# Patient Record
Sex: Male | Born: 1985 | Race: Black or African American | Hispanic: No | Marital: Single | Smoking: Current every day smoker
Health system: Southern US, Community
[De-identification: ages and names within clinical notes are randomized; demographics above are authoritative.]

---
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
## 2008-08-25 ENCOUNTER — Emergency Department (HOSPITAL_COMMUNITY): Admission: EM | Admit: 2008-08-25 | Discharge: 2008-08-25 | Payer: Self-pay | Admitting: Emergency Medicine

## 2008-10-07 ENCOUNTER — Emergency Department (HOSPITAL_COMMUNITY): Admission: EM | Admit: 2008-10-07 | Discharge: 2008-10-07 | Payer: Self-pay | Admitting: Emergency Medicine

## 2008-10-15 ENCOUNTER — Emergency Department (HOSPITAL_COMMUNITY): Admission: EM | Admit: 2008-10-15 | Discharge: 2008-10-15 | Payer: Self-pay | Admitting: Emergency Medicine

## 2008-12-28 ENCOUNTER — Emergency Department (HOSPITAL_COMMUNITY): Admission: EM | Admit: 2008-12-28 | Discharge: 2008-12-28 | Payer: Self-pay | Admitting: Emergency Medicine

## 2009-11-12 ENCOUNTER — Emergency Department (HOSPITAL_COMMUNITY): Admission: EM | Admit: 2009-11-12 | Discharge: 2009-11-12 | Payer: Self-pay | Admitting: Emergency Medicine

## 2011-02-04 NOTE — Op Note (Signed)
[place]. [facility]  Patient:    Martin Conrad, Martin Conrad                        MRN: [id number] Adm. Date:  [id number] Attending:  Leonia Corona                           Operative Report  PREOPERATIVE DIAGNOSES:  Foreign body, left foot.  POSTOPERATIVE DIAGNOSES:  Foreign body, left foot.  PROCEDURE:  Extraction of foreign body from left sole of the foot.  ANESTHESIA:  Local.  PROCEDURE IN DETAIL:  The patient is brought into operating room room, placed supine on operating room table.  The left foot is cleaned, prepped, and draped.  About 5 cc of 1% lidocaine is infiltrated in the sole of the left foot around the location of the foreign body.  After checking the effectiveness of the local anesthesia, a transverse about 0.5 cm is made right above the maximal elevation and probable side of foreign body.  A fine tip hemostat is used to explore this incision.  Blackish looking tip of what was noticed and was pulled out without any difficulty.  The wound was irrigated with hydrogen peroxide and left opened for healing.  Neosporin was applied and a Band-aid was placed.  No oozing or bleeding was noted.  Patient tolerated the procedure very well which was smooth and uneventful.  Patient was allowed to go home with instructions to follow-up. DD:  10/18/00 TD:  10/18/00 Job: [id number]

## 2012-07-01 ENCOUNTER — Emergency Department (HOSPITAL_COMMUNITY): Payer: No Typology Code available for payment source

## 2012-07-01 ENCOUNTER — Emergency Department (HOSPITAL_COMMUNITY)
Admission: EM | Admit: 2012-07-01 | Discharge: 2012-07-01 | Disposition: A | Discharge status: Home / Self Care | Payer: No Typology Code available for payment source | Attending: Emergency Medicine | Admitting: Emergency Medicine

## 2012-07-01 ENCOUNTER — Encounter (HOSPITAL_COMMUNITY): Payer: Self-pay | Admitting: *Deleted

## 2012-07-01 DIAGNOSIS — M549 Dorsalgia, unspecified: Secondary | ICD-10-CM

## 2012-07-01 DIAGNOSIS — M546 Pain in thoracic spine: Secondary | ICD-10-CM | POA: Insufficient documentation

## 2012-07-01 DIAGNOSIS — M542 Cervicalgia: Secondary | ICD-10-CM | POA: Insufficient documentation

## 2012-07-01 MED ORDER — ONDANSETRON HCL 4 MG/2ML IJ SOLN
4.0000 mg | Freq: Once | INTRAMUSCULAR | Status: AC
  Administered 2012-07-01: 4 mg via INTRAVENOUS
  Filled 2012-07-01: qty 2

## 2012-07-01 MED ORDER — HYDROCODONE-ACETAMINOPHEN 5-500 MG PO TABS: 1.0000 | ORAL_TABLET | Freq: Three times a day (TID) | ORAL | Status: DC | PRN

## 2012-07-01 MED ORDER — CYCLOBENZAPRINE HCL 10 MG PO TABS: 10.0000 mg | ORAL_TABLET | Freq: Two times a day (BID) | ORAL | Status: DC | PRN

## 2012-07-01 MED ORDER — IBUPROFEN 800 MG PO TABS: 800.0000 mg | ORAL_TABLET | Freq: Three times a day (TID) | ORAL | Status: DC

## 2012-07-01 MED ORDER — HYDROMORPHONE HCL PF 1 MG/ML IJ SOLN
1.0000 mg | Freq: Once | INTRAMUSCULAR | Status: AC
  Administered 2012-07-01: 1 mg via INTRAVENOUS
  Filled 2012-07-01: qty 1
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
## 2012-07-01 NOTE — ED Notes (Signed)
Patient transported to CT 

## 2012-07-02 ENCOUNTER — Telehealth (HOSPITAL_COMMUNITY): Payer: Self-pay | Admitting: *Deleted

## 2012-07-02 NOTE — ED Notes (Signed)
Pharmacy called wanting permission to fill RX for Vicodin only. Ok per [facility].

## 2013-10-26 ENCOUNTER — Encounter (HOSPITAL_COMMUNITY): Payer: Self-pay | Admitting: Emergency Medicine

## 2013-10-26 ENCOUNTER — Emergency Department (HOSPITAL_COMMUNITY)
Admission: EM | Admit: 2013-10-26 | Discharge: 2013-10-26 | Disposition: A | Discharge status: Home / Self Care | Payer: No Typology Code available for payment source | Attending: Emergency Medicine | Admitting: Emergency Medicine

## 2013-10-26 DIAGNOSIS — K0889 Other specified disorders of teeth and supporting structures: Secondary | ICD-10-CM

## 2013-10-26 DIAGNOSIS — R51 Headache: Secondary | ICD-10-CM | POA: Insufficient documentation

## 2013-10-26 DIAGNOSIS — K089 Disorder of teeth and supporting structures, unspecified: Secondary | ICD-10-CM | POA: Insufficient documentation

## 2013-10-26 DIAGNOSIS — F17200 Nicotine dependence, unspecified, uncomplicated: Secondary | ICD-10-CM | POA: Insufficient documentation

## 2013-10-26 MED ORDER — IBUPROFEN 800 MG PO TABS: 800.0000 mg | ORAL_TABLET | Freq: Three times a day (TID) | ORAL | Status: DC

## 2013-10-26 MED ORDER — AMOXICILLIN 500 MG PO CAPS: 500.0000 mg | ORAL_CAPSULE | Freq: Three times a day (TID) | ORAL | Status: DC

## 2013-10-26 MED ORDER — HYDROCODONE-ACETAMINOPHEN 5-325 MG PO TABS: 1.0000 | ORAL_TABLET | Freq: Four times a day (QID) | ORAL | Status: DC | PRN

## 2013-10-26 NOTE — ED Notes (Signed)
Pt reports left lower side dental pain x 6 months, no relief with oragel now and also having headaches.

## 2013-10-26 NOTE — ED Provider Notes (Signed)
CSN: [id number]     Arrival date & time 10/26/13  0920 History  This chart was scribed for Jaynie Crumble, PA working with Flint Melter, MD by Quintella Reichert, ED Scribe. This patient was seen in room TR06C/TR06C and the patient's care was started at 9:57 AM.   Chief Complaint  Patient presents with  . Dental Pain    The history is provided by the patient. No language interpreter was used.    HPI Comments: Martin Conrad is a 28 y.o. male who presents to the Emergency Department complaining of lower left-sided dental pain that has been intermittent for the past 6 months and worsening over the past 3 days.  Pt states he has a broken tooth in that area.  He describes pain as severe.  He has attempted to treat pain with Nyquil and Tylenol without relief.  He has not used ibuprofen.  Pt also complains of intermittent associated headaches worsened by coughing.  Pt does not have a dentist or dental insurance.    History reviewed. No pertinent past medical history.  History reviewed. No pertinent past surgical history.  History reviewed. No pertinent family history.   History  Substance Use Topics  . Smoking status: Current Every Day Smoker  . Smokeless tobacco: Not on file  . Alcohol Use: Yes     Review of Systems  HENT: Positive for dental problem.   Neurological: Positive for headaches.     Allergies  Ceclor  Home Medications  No current outpatient prescriptions on file.  BP 121/70  Pulse 80  Temp(Src) 97.9 F (36.6 C) (Oral)  Resp 16  Ht 6\' 1"  (1.854 m)  Wt 190 lb (86.183 kg)  BMI 25.07 kg/m2  SpO2 100%  Physical Exam  Nursing note and vitals reviewed. Constitutional: He is oriented to person, place, and time. He appears well-developed and well-nourished. No distress.  HENT:  Head: Normocephalic and atraumatic.  Right Ear: Tympanic membrane and ear canal normal.  Left Ear: Tympanic membrane and ear canal normal.  Mouth/Throat: Oropharynx is clear and  moist. No trismus in the jaw. No oropharyngeal exudate, posterior oropharyngeal edema or posterior oropharyngeal erythema.  Broken off left lower second molar.  Gum appears normal. No obvious abscess.  Tooth and gum tender to palpation. No trismus or swelling under the tongue.  Eyes: EOM are normal.  Neck: Neck supple. No tracheal deviation present.  Cardiovascular: Normal rate.   Pulmonary/Chest: Effort normal. No respiratory distress.  Musculoskeletal: Normal range of motion.  Neurological: He is alert and oriented to person, place, and time.  Skin: Skin is warm and dry.  Psychiatric: He has a normal mood and affect. His behavior is normal.    ED Course  Procedures (including critical care time)  DIAGNOSTIC STUDIES: Oxygen Saturation is 100% on room air, normal by my interpretation.    COORDINATION OF CARE: 10:00 AM-Discussed treatment plan which includes pain medication, antibiotics, and dental referral with pt at bedside and pt agreed to plan.     Labs Review Labs Reviewed - No data to display  Imaging Review No results found.  EKG Interpretation   None       MDM   1. Pain, dental      Patient dental pain and a headache. No signs of obvious infection at this time. No trismus the swelling of the tongue. Patient is able to open and close her mouth without difficulties. There is no facial swelling. Suspect dental pain to 2 cavity versus possible  early dental abscess. Will start on amoxicillin, ibuprofen Norco for pain, follow up with a dentist.  Filed Vitals:   10/26/13 0926  BP: 121/70  Pulse: 80  Temp: 97.9 F (36.6 C)  TempSrc: Oral  Resp: 16  Height: 6\' 1"  (1.854 m)  Weight: 190 lb (86.183 kg)  SpO2: 100%    I personally performed the services described in this documentation, which was scribed in my presence. The recorded information has been reviewed and is accurate.    Lottie Musselatyana A Adem Costlow, PA-C 10/26/13 1513

## 2013-10-26 NOTE — Discharge Instructions (Signed)
Ibuprofen for pain. Norco for severe pain. Amoxicillin as prescribed until all gone. Follow up with a dentist.    Dental Pain Toothache is pain in or around a tooth. It may get worse with chewing or with cold or heat.  HOME CARE  Your dentist may use a numbing medicine during treatment. If so, you may need to avoid eating until the medicine wears off. Ask your dentist about this.  Only take medicine as told by your dentist or doctor.  Avoid chewing food near the painful tooth until after all treatment is done. Ask your dentist about this. GET HELP RIGHT AWAY IF:   The problem gets worse or new problems appear.  You have a fever.  There is redness and puffiness (swelling) of the face, jaw, or neck.  You cannot open your mouth.  There is pain in the jaw.  There is very bad pain that is not helped by medicine. MAKE SURE YOU:   Understand these instructions.  Will watch your condition.  Will get help right away if you are not doing well or get worse. Document Released: 02/22/2008 Document Revised: 11/28/2011 Document Reviewed: 02/22/2008 [facility] Patient Information 2014 [place], [place].   Emergency Department Resource Guide 1) Find a Doctor and Pay Out of Pocket Although you won't have to find out who is covered by your insurance plan, it is a good idea to ask around and get recommendations. You will then need to call the office and see if the doctor you have chosen will accept you as a new patient and what types of options they offer for patients who are self-pay. Some doctors offer discounts or will set up payment plans for their patients who do not have insurance, but you will need to ask so you aren't surprised when you get to your appointment.  2) Contact Your Local Health Department Not all health departments have doctors that can see patients for sick visits, but many do, so it is worth a call to see if yours does. If you don't know where your local health department is,  you can check in your phone book. The CDC also has a tool to help you locate your state's health department, and many state websites also have listings of all of their local health departments.  3) Find a Walk-in Clinic If your illness is not likely to be very severe or complicated, you may want to try a walk in clinic. These are popping up all over the country in pharmacies, drugstores, and shopping centers. They're usually staffed by nurse practitioners or physician assistants that have been trained to treat common illnesses and complaints. They're usually fairly quick and inexpensive. However, if you have serious medical issues or chronic medical problems, these are probably not your best option.  No Primary Care Doctor: - Call Health Connect at  [phone] - they can help you locate a primary care doctor that  accepts your insurance, provides certain services, etc. - Physician Referral Service- [phone]  Chronic Pain Problems: Organization         Address  Phone   Notes  [facility]  [phone] Patients need to be referred by their primary care doctor.   Medication Assistance: Organization         Address  Phone   Notes  [facility] Medication [facility] [address] [place] [place], [place] [address] [phone] --Must be a resident of [facility] -- Must have NO insurance coverage  whatsoever (no Medicaid/ Medicare, etc.) -- The pt. MUST have a primary care doctor that directs their care regularly and follows them in the community   MedAssist  [phone]   [employer]  [phone]    Agencies that provide inexpensive medical care: Organization         Address  Phone   Notes  [facility]  [phone]   [facility]    [phone]   [facility] [address] [place], [place] [id number] [phone]   [facility] [address], [place] [phone])  [phone]   Planned Parenthood    [phone]   [facility]    [phone]) [phone]   [facility]  [address], [place] Phone:  [phone], Fax:  [phone] Hours of Operation:  9 am - 6 pm, M-F.  Also accepts Medicaid/Medicare and self-pay.  [facility]  [address], [place] Phone: [phone], Fax: [phone]. Hours of Operation:  8:30 am - 5:30 pm, M-F.  Also accepts Medicaid and self-pay.  [facility] [place] [address], [place] Phone: [phone]) [phone]   [facility] [address], [place], [place] [phone], Ext. [phone] Mondays & Thursdays: 7-9 AM.  First 15 patients are seen on a first come, first serve basis.    Medicaid-accepting [facility] Providers:  Organization         Address  Phone   Notes  [facility] [address], [place] [phone] Also accepts self-pay patients.  [facility] [address], [place]  [phone]   [facility] [address], [place] [phone]   [facility] [address] [place] [address], [place] [phone]) [phone]   Renaye RakersVeita Bland [address], [place]   [phone] Only accepts [place] Access [place] patients after they have their name applied to their card.   Self-Pay (no insurance) in [facility]:  Organization         Address  Phone   Notes  Sickle Cell Patients, [facility] [address], [place] [phone]   [facility] [address] [place], [place] [phone]) [phone]   [facility] [place]  [address], [place] [phone]   [facility]/Dr. Osei-Bonsu  [address] [place] [address], [place] or [phone], [place] [phone] Phone number for both [place] and [place] locations  is the same.  [facility] [address], [place] [phone]) [phone]   [facility] [place] [address] [place] [address], [place] or [address] [phone]   [facility] [address], [place] [phone]) [phone], phone; [phone], fax Sees patients 1st and 3rd Saturday of every month.  Must not qualify for public or private insurance (i.e. Medicaid, Medicare, [place] Health Choice, Veterans' Benefits)  Household income should be no more than 200% of the poverty level The clinic cannot treat you if you are pregnant or think you are pregnant  Sexually transmitted diseases are not treated at the clinic.    Dental Care: Organization         Address  Phone  Notes  [facility] Department of  Public Health [facility] [address] [place], [place] [phone] Accepts children up to age 32 who are enrolled in [place] or [place] Health Choice; pregnant women with a Medicaid card; and children who have applied for Medicaid or [place] Health Choice, but were declined, whose parents can pay a reduced fee at time of service.  [facility] Department of [facility]  [address], [place] [phone] Accepts children up to age 62 who are enrolled in [place] or [place] Health Choice; pregnant women with a Medicaid card; and children who have applied for Medicaid or [place] Health Choice, but were declined, whose parents can pay a reduced fee at time of service.  [facility]  [address] [place], [place] [phone] Patients are seen by appointment only. Walk-ins are not accepted. [facility] will see patients 81 years of age and older. Monday - Tuesday (8am-5pm) Most Wednesdays (8:30-5pm) $30 per visit, cash only  [facility] Adult Dental Access PROGRAM  [address], [facility] [phone] Patients are seen by appointment only. Walk-ins are not accepted. [facility] will see  patients 56 years of age and older. One Wednesday Evening (Monthly: Volunteer Based).  $30 per visit, cash only  [employer]  [phone] for adults; Children under age 73, call Graduate Pediatric Dentistry at [phone]. Children aged 56-14, please call [phone] to request a pediatric application.  Dental services are provided in all areas of dental care including fillings, crowns and bridges, complete and partial dentures, implants, gum treatment, root canals, and extractions. Preventive care is also provided. Treatment is provided to both adults and children. Patients are selected via a lottery and there is often a waiting list.   [facility] [address], [place]  [phone] [email or web address]   [facility] [address] [place], [place] [phone], Ext. [phone] Second and Fourth Thursday of each month, opens at 6:30 AM; Clinic ends at 9 AM.  Patients are seen on a first-come first-served basis, and a limited number are seen during each clinic.   [facility]  [address] Ether Griffins [place], [place] [phone]   Eligibility Requirements You must have lived in [place], [place], or [place] counties for at least the last three months.   You cannot be eligible for state or federal sponsored [place], including [employer], [place], or [employer].   You generally cannot be eligible for healthcare insurance through your employer.    How to apply: Eligibility screenings are held every Tuesday and Wednesday afternoon from 1:00 pm until 4:00 pm. You do not need an appointment for the interview!  [facility] [address], [place], [place] [phone]   [facility] Health Department  [phone]   [facility] Health Department  [phone]   [facility] Health Department  [phone]    Behavioral Health Resources in the Community: Intensive Outpatient  Programs Organization         Address  Phone  Notes  [facility] [address], [place], [place] [phone]   [facility] Outpatient [address], [place], [place] [phone]   [facility] [address], [place], [place]  [phone]   [facility] [address],  [place], [place] [phone] or [phone]   Substance Abuse Resources Organization         Address  Phone  Notes  Alcohol and Drug Services  [place]  [phone]   [facility] [place]  [phone]   Chinita Pester  [phone]   Residential & Outpatient Substance Abuse Program  [phone]   Psychological Services Organization         Address  Phone  Notes  [facility] [place]  [place]  [phone]   [place] [address], [place] or [phone]    Mobile Crisis Teams Organization         Address  Phone  Notes  [facility], Mobile Crisis Care Unit  [phone]   [facility]  [address] [place], [place]   [place] [address] [place] [phone]    Self-Help/Support Groups Organization         Address  Phone             Notes  [place] of [place] - variety of support groups  [place] Call for more information  Narcotics Anonymous (NA), [facility]aring [facility] [address], [employer] [place]  2 meetings at this location   Special educational needs teacher         Address  Phone  Notes  ASAP Residential Treatment Rafael Gonzalez,    [place]  [phone]   [facility]  [address] [place] [address], [place], [place]   [place], [place] [phone] Admissions: 8am-3pm M-F  Incentives Substance [place] [address],    [place], [place]  [phone]   [facility] [address] [place], [place], [place]   [facility] [address]  [place], [place]   Insight Programs - Intensive Outpatient [address], [place], [place]   [facility] ([place].,  [place], [place] [phone] or [phone]   [facility] (RTS) [address] [place], [place] Accepts Medicaid  [facility] [place] [address]  [place] [phone] Substance Abuse/Addiction Treatment   [facility] Organization         Address  Phone  Notes  [facility]  [phone]   Domenic Schwab, PhD [address] Arlis [place], [place]   [phone] or [phone]   [place] Bernie [place], [place] [phone]   [facility] [address], [place], [place] [phone] Insurance/Medicaid/sponsorship through [facility] and Families [address]                                    [place], [place] [phone] [place] [address], [place] [phone]    Dr. Adele Schilder  [phone]   [facility] Dept. 1) [address] [place] 2) [place] 3)  [place] [address], [place] [phone]  [phone]   [place] [phone] or [phone] (After Hours)

## 2013-10-26 NOTE — ED Provider Notes (Signed)
Medical screening examination/treatment/procedure(s) were performed by non-physician practitioner and as supervising physician I was immediately available for consultation/collaboration.  Flint MelterElliott L Annina Piotrowski, MD 10/26/13 [phone]

## 2015-08-07 ENCOUNTER — Emergency Department (HOSPITAL_COMMUNITY): Payer: Self-pay

## 2015-08-07 ENCOUNTER — Emergency Department (HOSPITAL_COMMUNITY)
Admission: EM | Admit: 2015-08-07 | Discharge: 2015-08-07 | Disposition: A | Discharge status: Home / Self Care | Payer: Self-pay | Attending: Emergency Medicine | Admitting: Emergency Medicine

## 2015-08-07 ENCOUNTER — Encounter (HOSPITAL_COMMUNITY): Payer: Self-pay | Admitting: *Deleted

## 2015-08-07 DIAGNOSIS — F17210 Nicotine dependence, cigarettes, uncomplicated: Secondary | ICD-10-CM | POA: Insufficient documentation

## 2015-08-07 DIAGNOSIS — R079 Chest pain, unspecified: Secondary | ICD-10-CM | POA: Insufficient documentation

## 2015-08-07 DIAGNOSIS — H938X2 Other specified disorders of left ear: Secondary | ICD-10-CM | POA: Insufficient documentation

## 2015-08-07 DIAGNOSIS — Z791 Long term (current) use of non-steroidal anti-inflammatories (NSAID): Secondary | ICD-10-CM | POA: Insufficient documentation

## 2015-08-07 DIAGNOSIS — J069 Acute upper respiratory infection, unspecified: Secondary | ICD-10-CM | POA: Insufficient documentation

## 2015-08-07 DIAGNOSIS — Z792 Long term (current) use of antibiotics: Secondary | ICD-10-CM | POA: Insufficient documentation

## 2015-08-07 NOTE — ED Notes (Signed)
Pt c/o chest pain and cough since last Wed.  States "cold like symptoms" prior to pain.  States his girlfriend was just dx with an URI and it freaked him out, so he's here.

## 2015-08-07 NOTE — ED Notes (Signed)
Pt here for c/o anterior chest pain x 1 week. States he 'had a cold and cough' recently. Pt is a smoker.

## 2015-08-07 NOTE — Discharge Instructions (Signed)
Upper Respiratory Infection, Adult °Most upper respiratory infections (URIs) are a viral infection of the air passages leading to the lungs. A URI affects the nose, throat, and upper air passages. The most common type of URI is nasopharyngitis and is typically referred to as "the common cold." °URIs run their course and usually go away on their own. Most of the time, a URI does not require medical attention, but sometimes a bacterial infection in the upper airways can follow a viral infection. This is called a secondary infection. Sinus and middle ear infections are common types of secondary upper respiratory infections. °Bacterial pneumonia can also complicate a URI. A URI can worsen asthma and chronic obstructive pulmonary disease (COPD). Sometimes, these complications can require emergency medical care and may be life threatening.  °CAUSES °Almost all URIs are caused by viruses. A virus is a type of germ and can spread from one person to another.  °RISKS FACTORS °You may be at risk for a URI if:  °· You smoke.   °· You have chronic heart or lung disease. °· You have a weakened defense (immune) system.   °· You are very young or very old.   °· You have nasal allergies or asthma. °· You work in crowded or poorly ventilated areas. °· You work in health care facilities or schools. °SIGNS AND SYMPTOMS  °Symptoms typically develop 2-3 days after you come in contact with a cold virus. Most viral URIs last 7-10 days. However, viral URIs from the influenza virus (flu virus) can last 14-18 days and are typically more severe. Symptoms may include:  °· Runny or stuffy (congested) nose.   °· Sneezing.   °· Cough.   °· Sore throat.   °· Headache.   °· Fatigue.   °· Fever.   °· Loss of appetite.   °· Pain in your forehead, behind your eyes, and over your cheekbones (sinus pain). °· Muscle aches.   °DIAGNOSIS  °Your health care provider may diagnose a URI by: °· Physical exam. °· Tests to check that your symptoms are not due to  another condition such as: °· Strep throat. °· Sinusitis. °· Pneumonia. °· Asthma. °TREATMENT  °A URI goes away on its own with time. It cannot be cured with medicines, but medicines may be prescribed or recommended to relieve symptoms. Medicines may help: °· Reduce your fever. °· Reduce your cough. °· Relieve nasal congestion. °HOME CARE INSTRUCTIONS  °· Take medicines only as directed by your health care provider.   °· Gargle warm saltwater or take cough drops to comfort your throat as directed by your health care provider. °· Use a warm mist humidifier or inhale steam from a shower to increase air moisture. This may make it easier to breathe. °· Drink enough fluid to keep your urine clear or pale yellow.   °· Eat soups and other clear broths and maintain good nutrition.   °· Rest as needed.   °· Return to work when your temperature has returned to normal or as your health care provider advises. You may need to stay home longer to avoid infecting others. You can also use a face mask and careful hand washing to prevent spread of the virus. °· Increase the usage of your inhaler if you have asthma.   °· Do not use any tobacco products, including cigarettes, chewing tobacco, or electronic cigarettes. If you need help quitting, ask your health care provider. °PREVENTION  °The best way to protect yourself from getting a cold is to practice good hygiene.  °· Avoid oral or hand contact with people with cold   symptoms.   Wash your hands often if contact occurs.  There is no clear evidence that vitamin C, vitamin E, echinacea, or exercise reduces the chance of developing a cold. However, it is always recommended to get plenty of rest, exercise, and practice good nutrition.  SEEK MEDICAL CARE IF:   You are getting worse rather than better.   Your symptoms are not controlled by medicine.   You have chills.  You have worsening shortness of breath.  You have brown or red mucus.  You have yellow or brown nasal  discharge.  You have pain in your face, especially when you bend forward.  You have a fever.  You have swollen neck glands.  You have pain while swallowing.  You have white areas in the back of your throat. SEEK IMMEDIATE MEDICAL CARE IF:   You have severe or persistent:  Headache.  Ear pain.  Sinus pain.  Chest pain.  You have chronic lung disease and any of the following:  Wheezing.  Prolonged cough.  Coughing up blood.  A change in your usual mucus.  You have a stiff neck.  You have changes in your:  Vision.  Hearing.  Thinking.  Mood. MAKE SURE YOU:   Understand these instructions.  Will watch your condition.  Will get help right away if you are not doing well or get worse.   This information is not intended to replace advice given to you by your health care provider. Make sure you discuss any questions you have with your health care provider.   Document Released: 03/01/2001 Document Revised: 01/20/2015 Document Reviewed: 12/11/2013 Elsevier Interactive Patient Education 2016 [employer] Gastroesophageal Reflux Disease, Adult Normally, food travels down the esophagus and stays in the stomach to be digested. However, when a person has gastroesophageal reflux disease (GERD), food and stomach acid move back up into the esophagus. When this happens, the esophagus becomes sore and inflamed. Over time, GERD can create small holes (ulcers) in the lining of the esophagus.  CAUSES This condition is caused by a problem with the muscle between the esophagus and the stomach (lower esophageal sphincter, or LES). Normally, the LES muscle closes after food passes through the esophagus to the stomach. When the LES is weakened or abnormal, it does not close properly, and that allows food and stomach acid to go back up into the esophagus. The LES can be weakened by certain dietary substances, medicines, and medical conditions, including:  Tobacco  use.  Pregnancy.  Having a hiatal hernia.  Heavy alcohol use.  Certain foods and beverages, such as coffee, chocolate, onions, and peppermint. RISK FACTORS This condition is more likely to develop in:  People who have an increased body weight.  People who have connective tissue disorders.  People who use NSAID medicines. SYMPTOMS Symptoms of this condition include:  Heartburn.  Difficult or painful swallowing.  The feeling of having a lump in the throat.  Abitter taste in the mouth.  Bad breath.  Having a large amount of saliva.  Having an upset or bloated stomach.  Belching.  Chest pain.  Shortness of breath or wheezing.  Ongoing (chronic) cough or a night-time cough.  Wearing away of tooth enamel.  Weight loss. Different conditions can cause chest pain. Make sure to see your health care provider if you experience chest pain. DIAGNOSIS Your health care provider will take a medical history and perform a physical exam. To determine if you have mild or severe GERD, your health care provider  may also monitor how you respond to treatment. You may also have other tests, including:  An endoscopy toexamine your stomach and esophagus with a small camera.  A test thatmeasures the acidity level in your esophagus.  A test thatmeasures how much pressure is on your esophagus.  A barium swallow or modified barium swallow to show the shape, size, and functioning of your esophagus. TREATMENT The goal of treatment is to help relieve your symptoms and to prevent complications. Treatment for this condition may vary depending on how severe your symptoms are. Your health care provider may recommend:  Changes to your diet.  Medicine.  Surgery. HOME CARE INSTRUCTIONS Diet  Follow a diet as recommended by your health care provider. This may involve avoiding foods and drinks such as:  Coffee and tea (with or without caffeine).  Drinks that containalcohol.  Energy  drinks and sports drinks.  Carbonated drinks or sodas.  Chocolate and cocoa.  Peppermint and mint flavorings.  Garlic and onions.  Horseradish.  Spicy and acidic foods, including peppers, chili powder, curry powder, vinegar, hot sauces, and barbecue sauce.  Citrus fruit juices and citrus fruits, such as oranges, lemons, and limes.  Tomato-based foods, such as red sauce, chili, salsa, and pizza with red sauce.  Fried and fatty foods, such as donuts, french fries, potato chips, and high-fat dressings.  High-fat meats, such as hot dogs and fatty cuts of red and white meats, such as rib eye steak, sausage, ham, and bacon.  High-fat dairy items, such as whole milk, butter, and cream cheese.  Eat small, frequent meals instead of large meals.  Avoid drinking large amounts of liquid with your meals.  Avoid eating meals during the 2-3 hours before bedtime.  Avoid lying down right after you eat.  Do not exercise right after you eat. General Instructions  Pay attention to any changes in your symptoms.  Take over-the-counter and prescription medicines only as told by your health care provider. Do not take aspirin, ibuprofen, or other NSAIDs unless your health care provider told you to do so.  Do not use any tobacco products, including cigarettes, chewing tobacco, and e-cigarettes. If you need help quitting, ask your health care provider.  Wear loose-fitting clothing. Do not wear anything tight around your waist that causes pressure on your abdomen.  Raise (elevate) the head of your bed 6 inches (15cm).  Try to reduce your stress, such as with yoga or meditation. If you need help reducing stress, ask your health care provider.  If you are overweight, reduce your weight to an amount that is healthy for you. Ask your health care provider for guidance about a safe weight loss goal.  Keep all follow-up visits as told by your health care provider. This is important. SEEK MEDICAL  CARE IF:  You have new symptoms.  You have unexplained weight loss.  You have difficulty swallowing, or it hurts to swallow.  You have wheezing or a persistent cough.  Your symptoms do not improve with treatment.  You have a hoarse voice. SEEK IMMEDIATE MEDICAL CARE IF:  You have pain in your arms, neck, jaw, teeth, or back.  You feel sweaty, dizzy, or light-headed.  You have chest pain or shortness of breath.  You vomit and your vomit looks like blood or coffee grounds.  You faint.  Your stool is bloody or black.  You cannot swallow, drink, or eat.   This information is not intended to replace advice given to you by your health  care provider. Make sure you discuss any questions you have with your health care provider.   Document Released: 06/15/2005 Document Revised: 05/27/2015 Document Reviewed: 12/31/2014 Elsevier Interactive Patient Education [employer]

## 2015-08-07 NOTE — ED Provider Notes (Signed)
CSN: [id number]     Arrival date & time 08/07/15  1416 History  By signing my name below, I, Soijett Blue, attest that this documentation has been prepared under the direction and in the presence of Roxy Horseman, PA-C Electronically Signed: Soijett Blue, ED Scribe. 08/07/2015. 3:33 PM.   Chief Complaint  Patient presents with  . Chest Pain      The history is provided by the patient. No language interpreter was used.    HPI Comments: Martin Conrad is a 29 y.o. male who presents to the Emergency Department complaining of CP onset 9 days ago. He reports that his girlfriend was recently dx with a URI and after he read her discharge papers he was concerned for pneumonia. He states that he is here today to be checked for pneumonia. He notes that he recently got over a cold. He states that he is having associated symptoms of mild productive cough x 9 days with clear sputum. He states that he has not tried any medications for the relief for his symptoms. He denies any other symptoms. He states that he is a cigarette smoker. Denies heart issues at this time. He notes that he has acid reflux and that he used to take medications for it, which he doesn't anymore.     History reviewed. No pertinent past medical history. History reviewed. No pertinent past surgical history. No family history on file. Social History  Substance Use Topics  . Smoking status: Current Every Day Smoker -- 0.50 packs/day    Types: Cigarettes  . Smokeless tobacco: None  . Alcohol Use: Yes    Review of Systems  Respiratory: Positive for cough.   Cardiovascular: Positive for chest pain.  Musculoskeletal: Negative for gait problem.  Skin: Negative for color change and wound.      Allergies  Ceclor  Home Medications   Prior to Admission medications   Medication Sig Start Date End Date Taking? Authorizing Provider  amoxicillin (AMOXIL) 500 MG capsule Take 1 capsule (500 mg total) by mouth 3 (three) times  daily. 10/26/13   Tatyana Kirichenko, PA-C  HYDROcodone-acetaminophen (NORCO) 5-325 MG per tablet Take 1 tablet by mouth every 6 (six) hours as needed for moderate pain. 10/26/13   Tatyana Kirichenko, PA-C  ibuprofen (ADVIL,MOTRIN) 800 MG tablet Take 1 tablet (800 mg total) by mouth 3 (three) times daily. 10/26/13   Tatyana Kirichenko, PA-C   BP 118/74 mmHg  Pulse 68  Temp(Src) 98.6 F (37 C) (Oral)  Resp 14  Ht  (1.854 m)  Wt 188 lb (85.276 kg)  BMI 24.81 kg/m2  SpO2 99% Physical Exam  Constitutional: He appears well-developed and well-nourished. No distress.  HENT:  Head: Normocephalic.  Right Ear: External ear normal.  Left Ear: External ear normal.  Mildly erythematous, no tonsillar exudate, no abscess, no stridor, uvula is midline  TMs clear bilaterally  Eyes: Conjunctivae and EOM are normal. Pupils are equal, round, and reactive to light.  Neck: Normal range of motion. Neck supple.  Cardiovascular: Normal rate, regular rhythm and normal heart sounds.  Exam reveals no gallop and no friction rub.   No murmur heard. Pulmonary/Chest: Effort normal and breath sounds normal. No stridor. No respiratory distress. He has no wheezes. He has no rales. He exhibits no tenderness.  CTAB  Abdominal: Soft. Bowel sounds are normal. He exhibits no distension. There is no tenderness.  Musculoskeletal: Normal range of motion. He exhibits no tenderness.  Neurological: He is alert.  Skin: Skin is  warm and dry. No rash noted. He is not diaphoretic.  Psychiatric: He has a normal mood and affect. His behavior is normal. Judgment and thought content normal.  Nursing note and vitals reviewed.   ED Course  Procedures (including critical care time) DIAGNOSTIC STUDIES: Oxygen Saturation is 99% on RA, nl by my interpretation.    COORDINATION OF CARE: 3:32 PM Discussed treatment plan with pt at bedside which includes CXR, EKG, zyrtec and tylenol and pt agreed to plan.     Imaging Review Dg Chest  2 View  08/07/2015  CLINICAL DATA:  Left chest pain for 2 days.  Cough. EXAM: CHEST  2 VIEW COMPARISON:  07/01/2012 FINDINGS: The heart size and mediastinal contours are within normal limits. Both lungs are clear. The visualized skeletal structures are unremarkable. No free air or bowel distention seen under the diaphragm. No effusions. IMPRESSION: Normal exam. Electronically Signed   By: Francene BoyersJames  Maxwell M.D.   On: 08/07/2015 15:22   I have personally reviewed and evaluated these images as part of my medical decision-making.   MDM   Final diagnoses:  URI (upper respiratory infection)    Pt CXR negative for acute infiltrate. Patients symptoms are consistent with URI, likely viral etiology. Discussed that antibiotics are not indicated for viral infections. Pt will be discharged with symptomatic treatment.  Verbalizes understanding and is agreeable with plan. Pt is hemodynamically stable & in NAD prior to dc.  Low risk for ACS or PE.    Could be GERD.  Hx of the same.  No longer taking treatment.  Advised patient to try prilosec OTC.  I personally performed the services described in this documentation, which was scribed in my presence. The recorded information has been reviewed and is accurate.      Roxy Horsemanobert Tate Jerkins, PA-C 08/07/15 1538  Blane OharaJoshua Zavitz, MD 08/09/15 [phone]

## 2015-10-06 ENCOUNTER — Encounter (HOSPITAL_COMMUNITY): Payer: Self-pay

## 2015-10-06 ENCOUNTER — Emergency Department (HOSPITAL_COMMUNITY)
Admission: EM | Admit: 2015-10-06 | Discharge: 2015-10-06 | Disposition: A | Discharge status: Home / Self Care | Payer: Self-pay | Attending: Emergency Medicine | Admitting: Emergency Medicine

## 2015-10-06 DIAGNOSIS — L02214 Cutaneous abscess of groin: Secondary | ICD-10-CM | POA: Insufficient documentation

## 2015-10-06 DIAGNOSIS — F17210 Nicotine dependence, cigarettes, uncomplicated: Secondary | ICD-10-CM | POA: Insufficient documentation

## 2015-10-06 MED ORDER — NAPROXEN 500 MG PO TABS: 500.0000 mg | ORAL_TABLET | Freq: Two times a day (BID) | ORAL | Status: DC

## 2015-10-06 MED ORDER — SULFAMETHOXAZOLE-TRIMETHOPRIM 800-160 MG PO TABS
1.0000 | ORAL_TABLET | Freq: Once | ORAL | Status: AC
  Administered 2015-10-06: 1 via ORAL
  Filled 2015-10-06: qty 1

## 2015-10-06 MED ORDER — SULFAMETHOXAZOLE-TRIMETHOPRIM 800-160 MG PO TABS: 1.0000 | ORAL_TABLET | Freq: Two times a day (BID) | ORAL | Status: AC

## 2015-10-06 MED ORDER — OXYCODONE-ACETAMINOPHEN 5-325 MG PO TABS
1.0000 | ORAL_TABLET | Freq: Once | ORAL | Status: AC
  Administered 2015-10-06: 1 via ORAL
  Filled 2015-10-06: qty 1

## 2015-10-06 MED ORDER — LIDOCAINE HCL (PF) 1 % IJ SOLN
5.0000 mL | Freq: Once | INTRAMUSCULAR | Status: AC
  Administered 2015-10-06: 5 mL
  Filled 2015-10-06: qty 5

## 2015-10-06 NOTE — ED Notes (Signed)
Gauze applied to abscess

## 2015-10-06 NOTE — ED Notes (Signed)
Pt here with c/o "boil" to left groin area that he noticed 3-4 days ago. Hx of recurrent abscesses.

## 2015-10-06 NOTE — ED Provider Notes (Signed)
CSN: [id number]     Arrival date & time 10/06/15  1703 History  By signing my name below, I, Martin Conrad, attest that this documentation has been prepared under the direction and in the presence of Kerrie Buffalo, NP  Electronically Signed: Jarvis Conrad, ED Scribe. 10/07/2015. 11:25 PM.     Chief Complaint  Patient presents with  . Abscess   Patient is a 30 y.o. male presenting with abscess. The history is provided by the patient. No language interpreter was used.  Abscess Location:  Ano-genital Ano-genital abscess location:  Groin Size:  2cm Abscess quality: painful and redness   Abscess quality: not draining   Red streaking: no   Duration:  4 days Progression:  Unchanged Pain details:    Severity:  Mild   Duration:  4 days   Timing:  Intermittent   Progression:  Unchanged Chronicity:  Recurrent Context: not diabetes   Relieved by:  None tried Exacerbated by: palpation. Ineffective treatments:  None tried Associated symptoms: no fever, no nausea and no vomiting   Risk factors: prior abscess     HPI Comments: Martin Conrad is a 30 y.o. male who presents to the Emergency Department complaining of an "abscess" to his left groin that began 4 days ago. He reports h/o abscesses in the past. He has not taken any medication prior to arrival. Pt reports the pain is exacerbated with palpation of the area. He denies any drainage from the area. Pt denies any fevers, chills, nausea, vomiting, testicular pain, testicular swelling, scrotal swelling, penile swelling, or other associated symptoms.  History reviewed. No pertinent past medical history. History reviewed. No pertinent past surgical history. No family history on file. Social History  Substance Use Topics  . Smoking status: Current Every Day Smoker -- 0.50 packs/day    Types: Cigarettes  . Smokeless tobacco: None  . Alcohol Use: Yes    Review of Systems  Constitutional: Negative for fever.  Gastrointestinal: Negative for  nausea and vomiting.  Genitourinary: Negative for penile swelling, scrotal swelling and testicular pain.  Skin: Positive for wound.       Abscess left groin  all other systems negative    Allergies  Ceclor  Home Medications   Prior to Admission medications   Medication Sig Start Date End Date Taking? Authorizing Provider  naproxen (NAPROSYN) 500 MG tablet Take 1 tablet (500 mg total) by mouth 2 (two) times daily. 10/06/15   Libero Puthoff Orlene Och, NP  sulfamethoxazole-trimethoprim (BACTRIM DS,SEPTRA DS) 800-160 MG tablet Take 1 tablet by mouth 2 (two) times daily. 10/06/15 10/13/15  Hasan Douse Orlene Och, NP   BP 120/80 mmHg  Pulse 84  Temp(Src) 98.2 F (36.8 C) (Oral)  Resp 16  Ht  (1.854 m)  Wt 83.915 kg  BMI 24.41 kg/m2  SpO2 98% Physical Exam  Constitutional: He is oriented to person, place, and time. He appears well-developed and well-nourished.  Eyes: EOM are normal.  Neck: Neck supple.  Pulmonary/Chest: Effort normal.  Abdominal: Soft. There is no tenderness.  Musculoskeletal: Normal range of motion.  Neurological: He is alert and oriented to person, place, and time. No cranial nerve deficit.  Skin: Skin is warm and dry.  Tender, raised, 2cm area to left inguinal area BB sized nodes in left inguinal area  Nursing note and vitals reviewed.   ED Course  Procedures (including critical care time)  DIAGNOSTIC STUDIES: Oxygen Saturation is 100% on RA, normal by my interpretation.    COORDINATION OF CARE: 6:40 PM-  Discussed I&D with pt. Pt advised of plan for treatment and pt agrees.  6:50 pmINCISION AND DRAINAGE PROCEDURE NOTE: Patient identification was confirmed and verbal consent was obtained. This procedure was performed by Kerrie Buffalo, NP at 6:50 pmSite: left inguinal area Sterile procedures observed Needle size: 25 Anesthetic used (type and amt): 1cc of lidocaine w/o epi Blade size: 11 Drainage: moderate amount of purulent discharge Complexity: Complex Packing used:  none Site anesthetized, incision made over site, wound drained and explored loculations, rinsed with copious amounts of normal saline, wound covered with dry, sterile dressing.  Pt tolerated procedure well without complications.  Instructions for care discussed verbally and pt provided with additional written instructions for homecare and f/u.    MDM  30 y.o. male with raised tender area to the left inguinal area stable for d/c without fever and does not appear toxic. Will treat with antibiotics and pain mediation and he will apply warm wet compresses tot he area. He will return for any problems.   Final diagnoses:  Abscess of groin, left   I personally performed the services described in this documentation, which was scribed in my presence. The recorded information has been reviewed and is accurate.     [address] [place], NP 10/07/15 1610  Leta Baptist, MD 10/09/15 [phone]

## 2016-06-10 ENCOUNTER — Emergency Department (HOSPITAL_COMMUNITY)
Admission: EM | Admit: 2016-06-10 | Discharge: 2016-06-10 | Disposition: A | Discharge status: Home / Self Care | Payer: Self-pay | Attending: Emergency Medicine | Admitting: Emergency Medicine

## 2016-06-10 ENCOUNTER — Encounter (HOSPITAL_COMMUNITY): Payer: Self-pay | Admitting: Emergency Medicine

## 2016-06-10 DIAGNOSIS — J0100 Acute maxillary sinusitis, unspecified: Secondary | ICD-10-CM | POA: Insufficient documentation

## 2016-06-10 DIAGNOSIS — F17210 Nicotine dependence, cigarettes, uncomplicated: Secondary | ICD-10-CM | POA: Insufficient documentation

## 2016-06-10 MED ORDER — AZITHROMYCIN 250 MG PO TABS: 250.0000 mg | ORAL_TABLET | Freq: Every day | ORAL | 0 refills | Status: DC

## 2016-06-10 MED ORDER — OXYMETAZOLINE HCL 0.05 % NA SOLN: 1.0000 | Freq: Two times a day (BID) | NASAL | 0 refills | Status: DC

## 2016-06-10 MED ORDER — LORATADINE-PSEUDOEPHEDRINE ER 10-240 MG PO TB24: 1.0000 | ORAL_TABLET | Freq: Every day | ORAL | 0 refills | Status: AC

## 2016-06-10 NOTE — Discharge Instructions (Signed)
Take your medications as prescribed. Please follow up with a primary care provider from the Resource Guide provided below in one week if your symptoms have not improved. Please return to the Emergency Department if symptoms worsen or new onset of fever, headache, neck stiffness, difficulty breathing, coughing up blood, chest pain, facial/neck swelling.

## 2016-06-10 NOTE — ED Triage Notes (Signed)
Pt arrives via POV from home with approx 3 weeks of sinus congestion, headaches and nonproductive cough. Pt with lungs CTA. Current everyday smoker. Denies fever. A&Ox4

## 2016-06-10 NOTE — ED Provider Notes (Signed)
[facility]-EMERGENCY DEPT Provider Note   CSN: [id number] Arrival date & time: 06/10/16  1118  By signing my name below, I, Martin Conrad, attest that this documentation has been prepared under the direction and in the presence of Martin Conrad, [place]. Electronically Signed: Linna Darnerussell Conrad, Scribe. 06/10/2016. 12:25 PM.  History   Chief Complaint Chief Complaint  Patient presents with  . Nasal Congestion    The history is provided by the patient. No language interpreter was used.     HPI Comments: Martin Conrad is a [age] y.o. male who presents to the Emergency Department complaining of sudden onset, constant, unchanged, nasal congestion for the last 2-3 weeks. He notes associated sore throat and sinus pressure. Pt has tried Mucinex x1 with some relief. 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 Pt believes he has seasonal allergies. He denies sick contacts with similar symptoms. Pt notes he is a smoker. He reports headaches at baseline as well as a cough (from smoking) but denies acute headache or cough. He has no known medical problems or allergies to medications. Pt denies eye watering, eye itching, SOB, wheezing, CP, abdominal pain, nausea, vomiting, fever, rhinorrhea, ear pain, or any other associated symptoms.  History reviewed. No pertinent past medical history.  There are no active problems to display for this patient.   History reviewed. No pertinent surgical history.     Home Medications    Prior to Admission medications   Medication Sig Start Date End Date Taking? Authorizing Provider  azithromycin (ZITHROMAX) 250 MG tablet Take 1 tablet (250 mg total) by mouth daily. Take first 2 tablets together, then 1 every day until finished. 06/10/16   Barrett HenleNicole Elizabeth Cj Beecher, PA-C  loratadine-pseudoephedrine (CLARITIN-D 24 HOUR) 10-240 MG 24 hr tablet Take 1 tablet by mouth daily. 06/10/16   Barrett HenleNicole Elizabeth Antoinne Spadaccini, PA-C  naproxen (NAPROSYN) 500 MG tablet Take 1  tablet (500 mg total) by mouth 2 (two) times daily. 10/06/15   Hope Orlene OchM Neese, NP  oxymetazoline (AFRIN NASAL SPRAY) 0.05 % nasal spray Place 1 spray into both nostrils 2 (two) times daily. Do not use for more than 3 days to prevent rebound rhinorrhea. 06/10/16   Barrett HenleNicole Elizabeth Zayne Marovich, PA-C    Family History History reviewed. No pertinent family history.  Social History Social History  Substance Use Topics  . Smoking status: Current Every Day Smoker    Packs/day: 0.50    Types: Cigarettes  . Smokeless tobacco: Not on file  . Alcohol use Yes     Allergies   Ceclor [cefaclor]   Review of Systems Review of Systems  Constitutional: Negative for fever.  HENT: Positive for congestion, sinus pressure and sore throat. Negative for ear pain and rhinorrhea.   Eyes: Negative for itching.       Negative for eye watering.  Respiratory: Positive for cough (at baseline). Negative for wheezing.   Cardiovascular: Negative for chest pain.  Gastrointestinal: Negative for abdominal pain, nausea and vomiting.  Neurological: Positive for headaches (at baseline).  All other systems reviewed and are negative.   Physical Exam Updated Vital Signs BP (!) 151/101 (BP Location: Right Arm)   Pulse 90   Temp 98.2 F (36.8 C) (Oral)   Resp 16   Wt 77.1 kg   SpO2 100%   BMI 22.43 kg/m   Physical Exam  Constitutional: He is oriented to person, place, and time. He appears well-developed and well-nourished.  HENT:  Head: Normocephalic and atraumatic.  Right  Ear: Tympanic membrane normal.  Left Ear: Tympanic membrane normal.  Nose: Right sinus exhibits maxillary sinus tenderness. Left sinus exhibits maxillary sinus tenderness.  Mouth/Throat: Uvula is midline, oropharynx is clear and moist and mucous membranes are normal. No oropharyngeal exudate, posterior oropharyngeal edema, posterior oropharyngeal erythema or tonsillar abscesses.  Eyes: Conjunctivae and EOM are normal. Right eye exhibits no  discharge. Left eye exhibits no discharge. No scleral icterus.  Neck: Normal range of motion. Neck supple.  Cardiovascular: Normal rate, regular rhythm, normal heart sounds and intact distal pulses.   Pulmonary/Chest: Effort normal and breath sounds normal. No respiratory distress. He has no wheezes. He has no rales. He exhibits no tenderness.  Abdominal: Soft. He exhibits no distension.  Musculoskeletal: He exhibits no edema.  Lymphadenopathy:    He has no cervical adenopathy.  Neurological: He is alert and oriented to person, place, and time.  Skin: Skin is warm and dry.  Nursing note and vitals reviewed.   ED Treatments / Results  Labs (all labs ordered are listed, but only abnormal results are displayed) Labs Reviewed - No data to display  EKG  EKG Interpretation None       Radiology No results found.  Procedures Procedures (including critical care time)  DIAGNOSTIC STUDIES: Oxygen Saturation is 100% on RA, normal by my interpretation.    COORDINATION OF CARE: 12:31 PM Discussed treatment plan with pt at bedside and pt agreed to plan.  Medications Ordered in ED Medications - No data to display   Initial Impression / Assessment and Plan / ED Course  I have reviewed the triage vital signs and the nursing notes.  Pertinent labs & imaging results that were available during my care of the patient were reviewed by me and considered in my medical decision making (see chart for details).  Clinical Course    Patient complaining of symptoms of sinusitis.    Moderat symptoms have been present for greater than 10 days with purulent nasal discharge and maxillary sinus pain.  Concern for acute bacterial rhinosinusitis.  Patient discharged with azithromycin for sinusitis and antihistamine for suspected seasonal allergies.  Instructions given for warm saline nasal wash and recommendations for follow-up with primary care physician.    I personally performed the services  described in this documentation, which was scribed in my presence. The recorded information has been reviewed and is accurate.   Final Clinical Impressions(s) / ED Diagnoses   Final diagnoses:  Acute maxillary sinusitis, recurrence not specified    New Prescriptions New Prescriptions   AZITHROMYCIN (ZITHROMAX) 250 MG TABLET    Take 1 tablet (250 mg total) by mouth daily. Take first 2 tablets together, then 1 every day until finished.   LORATADINE-PSEUDOEPHEDRINE (CLARITIN-D 24 HOUR) 10-240 MG 24 HR TABLET    Take 1 tablet by mouth daily.   OXYMETAZOLINE (AFRIN NASAL SPRAY) 0.05 % NASAL SPRAY    Place 1 spray into both nostrils 2 (two) times daily. Do not use for more than 3 days to prevent rebound rhinorrhea.     Satira Sark [place], [place] 06/10/16 1245    Zadie Rhine, MD 06/11/16 [phone]

## 2017-05-15 ENCOUNTER — Ambulatory Visit (INDEPENDENT_AMBULATORY_CARE_PROVIDER_SITE_OTHER): Payer: Self-pay

## 2017-05-15 ENCOUNTER — Encounter (HOSPITAL_COMMUNITY): Payer: Self-pay | Admitting: Emergency Medicine

## 2017-05-15 ENCOUNTER — Ambulatory Visit (HOSPITAL_COMMUNITY)
Admission: EM | Admit: 2017-05-15 | Discharge: 2017-05-15 | Disposition: A | Discharge status: Home / Self Care | Payer: Self-pay | Attending: Family Medicine | Admitting: Family Medicine

## 2017-05-15 DIAGNOSIS — M25551 Pain in right hip: Secondary | ICD-10-CM

## 2017-05-15 MED ORDER — DICLOFENAC SODIUM 75 MG PO TBEC: 75.0000 mg | DELAYED_RELEASE_TABLET | Freq: Two times a day (BID) | ORAL | 0 refills | Status: AC

## 2017-05-15 NOTE — Discharge Instructions (Signed)
There is a small, 8 mm, lesion on your right acetabulum, this is a portion of the bone off of your femur. This will require MRI for further evaluation and identification. If your pain persists or fails to resolve, schedule an appointment with a orthopedist who can order this test. For pain, switch to diclofenac, one tablet twice a day. You can take over-the-counter Tylenol with this as needed.

## 2017-05-15 NOTE — ED Triage Notes (Signed)
PT reports intermittent right hip pain for 2 years. PT reports pain has been constant for 4 days. No injury. PT works on his feet in a kitchen. Pain does not radiate down leg.

## 2017-05-15 NOTE — ED Provider Notes (Signed)
  [facility] CARE CENTER   [id number] 05/15/17 Arrival Time: 1218   SUBJECTIVE:  Martin Conrad is a 31 y.o. male who presents to the urgent care  with complaint of right hip pain that is been intermittent for 2 years, but worsened over the last 4 days. He played contact sports in high school, primarily football, denies any recent injuries. He works in a kitchen, and is on his feet throughout most of the day. Pain is described as worse in the morning when he first gets up, gradually improves and then worsens towards the evening. Has no numbness or tingling, unexpected weight loss, night sweats, or chills. Otherwise history is unremarkable  ROS: As per HPI, remainder of ROS negative.   OBJECTIVE:  Vitals:   05/15/17 1306 05/15/17 1307  BP:  118/67  Pulse:  72  Resp:  16  Temp:  98.8 F (37.1 C)  TempSrc:  Oral  SpO2:  100%  Weight: 180 lb (81.6 kg)   Height: 6' (1.829 m)      General appearance: alert; no distress HEENT: normocephalic; atraumatic; conjunctivae normal;  Neck: Trachea midline, no JVD noted Lungs: clear to auscultation bilaterally Heart: regular rate and rhythm Abdomen: soft, non-tender;  Musculoskeletal: No pain with abduction and adduction flexion and rotation of the hip, no point tenderness over the trochanteric person or ischiogluteal bursa Skin: warm and dry Neurologic: Grossly normal Psychological:  alert and cooperative; normal mood and affect     ASSESSMENT & PLAN:  1. Right hip pain     Meds ordered this encounter  Medications  . diclofenac (VOLTAREN) 75 MG EC tablet    Sig: Take 1 tablet (75 mg total) by mouth 2 (two) times daily.    Dispense:  20 tablet    Refill:  0    Order Specific Question:   Supervising Provider    Answer:   Mardella Layman [[id number]]    Will start on diclofenac for pain. Small area of lucency seen on the x-ray, we referred to orthopedics for further workup and evaluation. As per radiology note, this may likely need an  MRI for further evaluation  Reviewed expectations re: course of current medical issues. Questions answered. Outlined signs and symptoms indicating need for more acute intervention. Patient verbalized understanding. After Visit Summary given.    Procedures:     No results found for this or any previous visit.  Labs Reviewed - No data to display  Dg Hip Unilat W Or Wo Pelvis 2-3 Views Right  Result Date: 05/15/2017 CLINICAL DATA:  Right hip Pain off/on x2 years, constant over the past 4 days EXAM: DG HIP (WITH OR WITHOUT PELVIS) 2-3V RIGHT COMPARISON:  None. FINDINGS: There is no evidence of hip fracture or dislocation. 8 mm lucency with sclerotic margin in the superior acetabulum. There is no other evidence of arthropathy or other focal bone abnormality. IMPRESSION: 1. 8 mm lucency in the superior right acetabulum, may represent degenerative cysts/geode, vs osteoid osteoma or other benign bone lesion. Consider MR for further characterization if symptoms persist. Electronically Signed   By: Corlis Leak M.D.   On: 05/15/2017 14:16    Allergies  Allergen Reactions  . Ceclor [Cefaclor] Hives    PMHx, SurgHx, SocialHx, Medications, and Allergies were reviewed in the Visit Navigator and updated as appropriate.       Dorena Bodo, NP 05/15/17 1453

## 2018-01-01 ENCOUNTER — Other Ambulatory Visit: Payer: Self-pay

## 2018-01-01 ENCOUNTER — Encounter (HOSPITAL_COMMUNITY): Payer: Self-pay

## 2018-01-01 ENCOUNTER — Emergency Department (HOSPITAL_COMMUNITY)
Admission: EM | Admit: 2018-01-01 | Discharge: 2018-01-01 | Disposition: A | Discharge status: Home / Self Care | Payer: Self-pay | Attending: Emergency Medicine | Admitting: Emergency Medicine

## 2018-01-01 DIAGNOSIS — R109 Unspecified abdominal pain: Secondary | ICD-10-CM | POA: Insufficient documentation

## 2018-01-01 DIAGNOSIS — Z79899 Other long term (current) drug therapy: Secondary | ICD-10-CM | POA: Insufficient documentation

## 2018-01-01 DIAGNOSIS — F17210 Nicotine dependence, cigarettes, uncomplicated: Secondary | ICD-10-CM | POA: Insufficient documentation

## 2018-01-01 LAB — LIPASE, BLOOD: Lipase: 71 U/L — ABNORMAL HIGH (ref 11–51)

## 2018-01-01 LAB — COMPREHENSIVE METABOLIC PANEL
ALT: 31 U/L (ref 17–63)
AST: 44 U/L — AB (ref 15–41)
Albumin: 3.8 g/dL (ref 3.5–5.0)
Alkaline Phosphatase: 69 U/L (ref 38–126)
Anion gap: 7 (ref 5–15)
BILIRUBIN TOTAL: 0.5 mg/dL (ref 0.3–1.2)
BUN: 12 mg/dL (ref 6–20)
CHLORIDE: 105 mmol/L (ref 101–111)
CO2: 26 mmol/L (ref 22–32)
Calcium: 8.9 mg/dL (ref 8.9–10.3)
Creatinine, Ser: 1.44 mg/dL — ABNORMAL HIGH (ref 0.61–1.24)
GFR calc Af Amer: >60 mL/min (ref >60)
Glucose, Bld: 110 mg/dL — ABNORMAL HIGH (ref 65–99)
Potassium: 4.5 mmol/L (ref 3.5–5.1)
Sodium: 138 mmol/L (ref 135–145)
TOTAL PROTEIN: 6.6 g/dL (ref 6.5–8.1)

## 2018-01-01 LAB — CBC
HCT: 45 % (ref 39.0–52.0)
Hemoglobin: 15.7 g/dL (ref 13.0–17.0)
MCH: 32.7 pg (ref 26.0–34.0)
MCHC: 34.9 g/dL (ref 30.0–36.0)
MCV: 93.8 fL (ref 78.0–100.0)
Platelets: 239 10*3/uL (ref 150–400)
RBC: 4.8 MIL/uL (ref 4.22–5.81)
RDW: 13.8 % (ref 11.5–15.5)
WBC: 5.2 10*3/uL (ref 4.0–10.5)

## 2018-01-01 NOTE — Discharge Instructions (Addendum)
Please read attached information. If you experience any new or worsening signs or symptoms please return to the emergency room for evaluation. Please follow-up with your primary care provider or specialist as discussed.  °

## 2018-01-01 NOTE — ED Provider Notes (Signed)
[facility] Provider Note   CSN: [id number] Arrival date & time: 01/01/18  1329  History   Chief Complaint Chief Complaint  Patient presents with  . Abdominal Pain     HPI Martin Conrad is a 32 y.o. male.   HPI   32 year old male presents today with complaints of abd pain. Pt reports that yesterday he sneezed and felt pain in his right lower mid abdomen.  He notes the pain is worse with movement, not worse with palpation.  He denies any pain swelling or masses in his testicles or scrotum.  Patient denies any history of the same.  Patient reports normal bowel movement, normal urination no nausea vomiting or fever.  Patient also notes that he drank excessively this weekend and had very little water intake.  No history of abdominal surgeries.     History reviewed. No pertinent past medical history.  There are no active problems to display for this patient.   History reviewed. No pertinent surgical history.      Home Medications    Prior to Admission medications   Medication Sig Start Date End Date Taking? Authorizing Provider  diclofenac (VOLTAREN) 75 MG EC tablet Take 1 tablet (75 mg total) by mouth 2 (two) times daily. 05/15/17   Dorena BodoKennard, [place], NP  loratadine-pseudoephedrine (CLARITIN-D 24 HOUR) 10-240 MG 24 hr tablet Take 1 tablet by mouth daily. 06/10/16   Barrett HenleNadeau, Nicole Elizabeth, PA-C    Family History History reviewed. No pertinent family history.  Social History Social History   Tobacco Use  . Smoking status: Current Every Day Smoker    Packs/day: 0.50    Types: Cigarettes  . Smokeless tobacco: Never Used  Substance Use Topics  . Alcohol use: Yes  . Drug use: No     Allergies   Ceclor [cefaclor]   Review of Systems Review of Systems  All other systems reviewed and are negative.   Physical Exam Updated Vital Signs BP 130/88 (BP Location: Right Arm)   Pulse (!) 57   Temp 98 F (36.7 C) (Oral)   Resp  18   SpO2 100%   Physical Exam  Constitutional: He is oriented to person, place, and time. He appears well-developed and well-nourished.  HENT:  Head: Normocephalic and atraumatic.  Eyes: Pupils are equal, round, and reactive to light. Conjunctivae are normal. Right eye exhibits no discharge. Left eye exhibits no discharge. No scleral icterus.  Neck: Normal range of motion. No JVD present. No tracheal deviation present.  Pulmonary/Chest: Effort normal. No stridor.  Abdominal: Soft. Bowel sounds are normal. He exhibits no distension and no mass. There is no tenderness. There is no rebound and no guarding. No hernia.  Neurological: He is alert and oriented to person, place, and time. Coordination normal.  Psychiatric: He has a normal mood and affect. His behavior is normal. Judgment and thought content normal.  Nursing note and vitals reviewed.    ED Treatments / Results  Labs (all labs ordered are listed, but only abnormal results are displayed) Labs Reviewed  LIPASE, BLOOD - Abnormal; Notable for the following components:      Result Value   Lipase 71 (*)    All other components within normal limits  COMPREHENSIVE METABOLIC PANEL - Abnormal; Notable for the following components:   Glucose, Bld 110 (*)    Creatinine, Ser 1.44 (*)    AST 44 (*)    All other components within normal limits  CBC  URINALYSIS, ROUTINE W  REFLEX MICROSCOPIC    EKG None  Radiology No results found.  Procedures Procedures (including critical care time)  Medications Ordered in ED Medications - No data to display   Initial Impression / Assessment and Plan / ED Course  I have reviewed the triage vital signs and the nursing notes.  Pertinent labs & imaging results that were available during my care of the patient were reviewed by me and considered in my medical decision making (see chart for details).      Final Clinical Impressions(s) / ED Diagnoses   Final diagnoses:  Abdominal pain,  unspecified abdominal location    Labs: Lipase, CMP, CBC-creatinine 1.44, lipase 71  Imaging:  Consults:  Therapeutics:  Discharge Meds:   Assessment/Plan: 32 year old male presents today with complaints of right-sided abdominal pain.  He has a soft benign nontender abdomen.  He has no masses.  Patient with likely muscular strain, very low suspicion for hernia, low suspicion for appendicitis or any acute intra-abdominal pathology.  Patient will use ibuprofen at home, return if symptoms persist or worsen.  Patient also had elevated creatinine lipase, likely secondary to excess alcohol intake.  Patient will follow-up as an outpatient for repeat laboratory analysis.  Patient verbalized understanding and agreement to today's plan had no further questions or concerns at time of discharge.    ED Discharge Orders    None       Rosalio Loud 01/01/18 1618    Arby Barrette, MD 01/01/18 1810

## 2018-01-01 NOTE — ED Triage Notes (Signed)
Pt reports yesterday he sneezed and began having pain in the RLQ. Pt states the pain is worse with movement. Pt denies any nausea or vomiting. No swelling or bruising noted to the area.

## 2018-01-11 IMAGING — DX DG HIP (WITH OR WITHOUT PELVIS) 2-3V*R*
3 series · 3 of 3 positions shown · non-contrast
Comparison: None.

CLINICAL DATA: Right hip Pain off/on x2 years, constant over the
past 4 days

EXAM:
DG HIP (WITH OR WITHOUT PELVIS) 2-3V RIGHT

[pelvis ap]
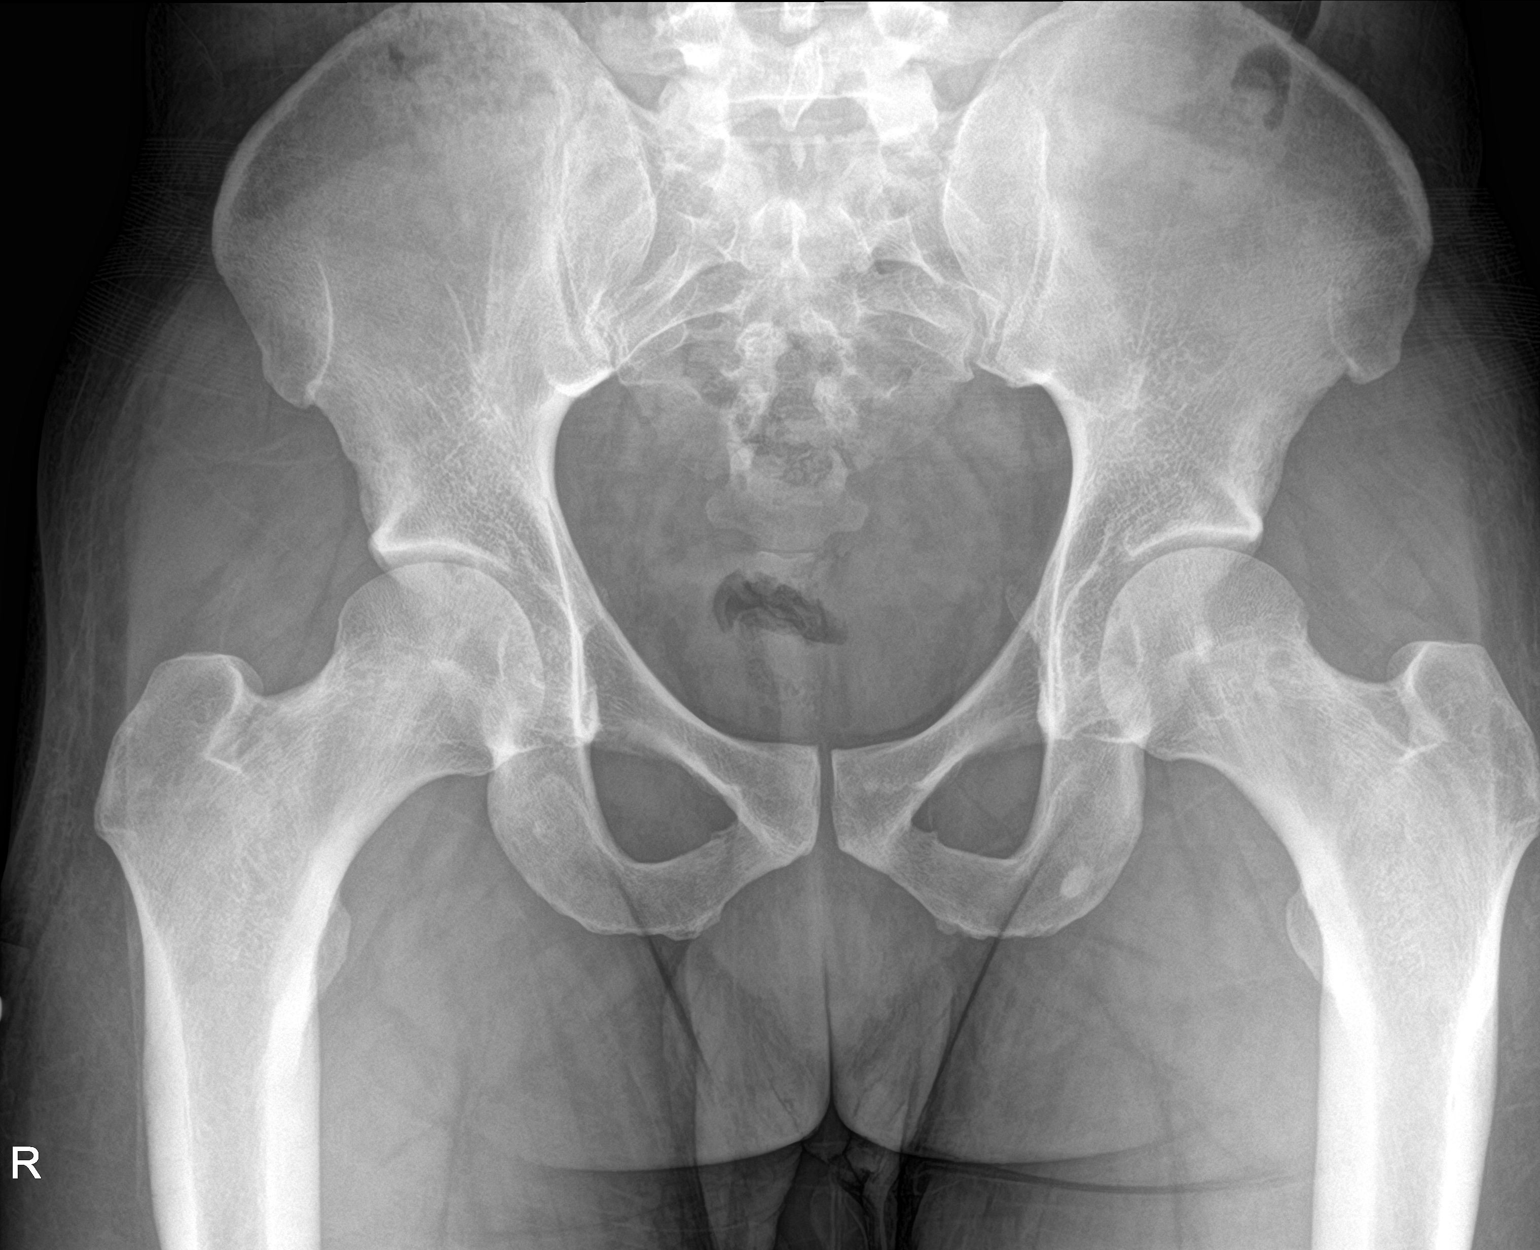

[hip ap]
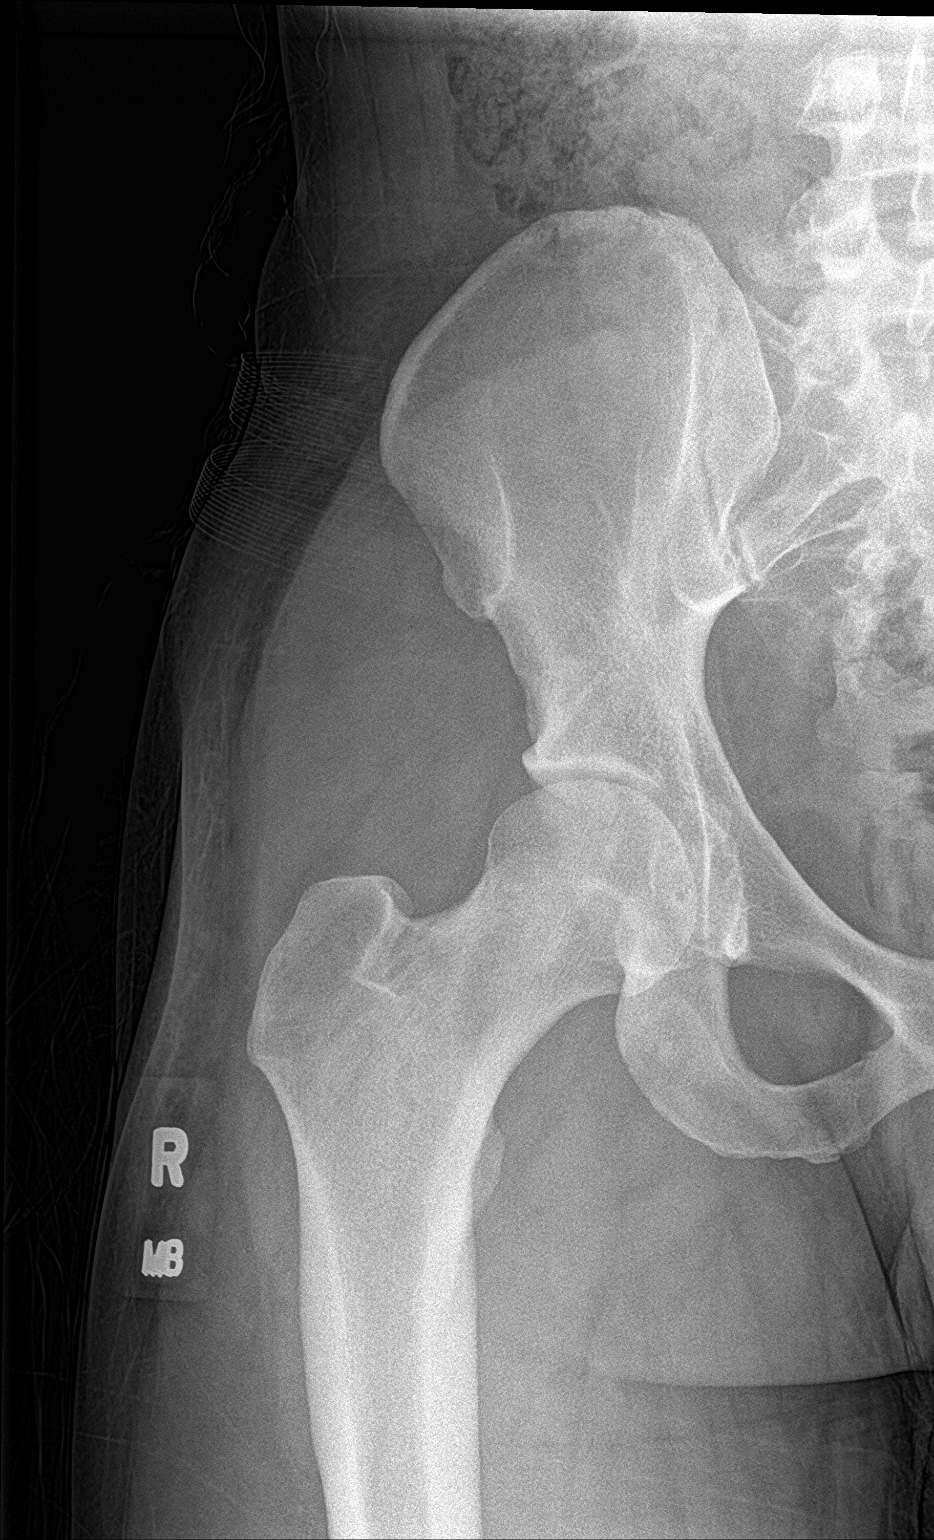

[hip lat]
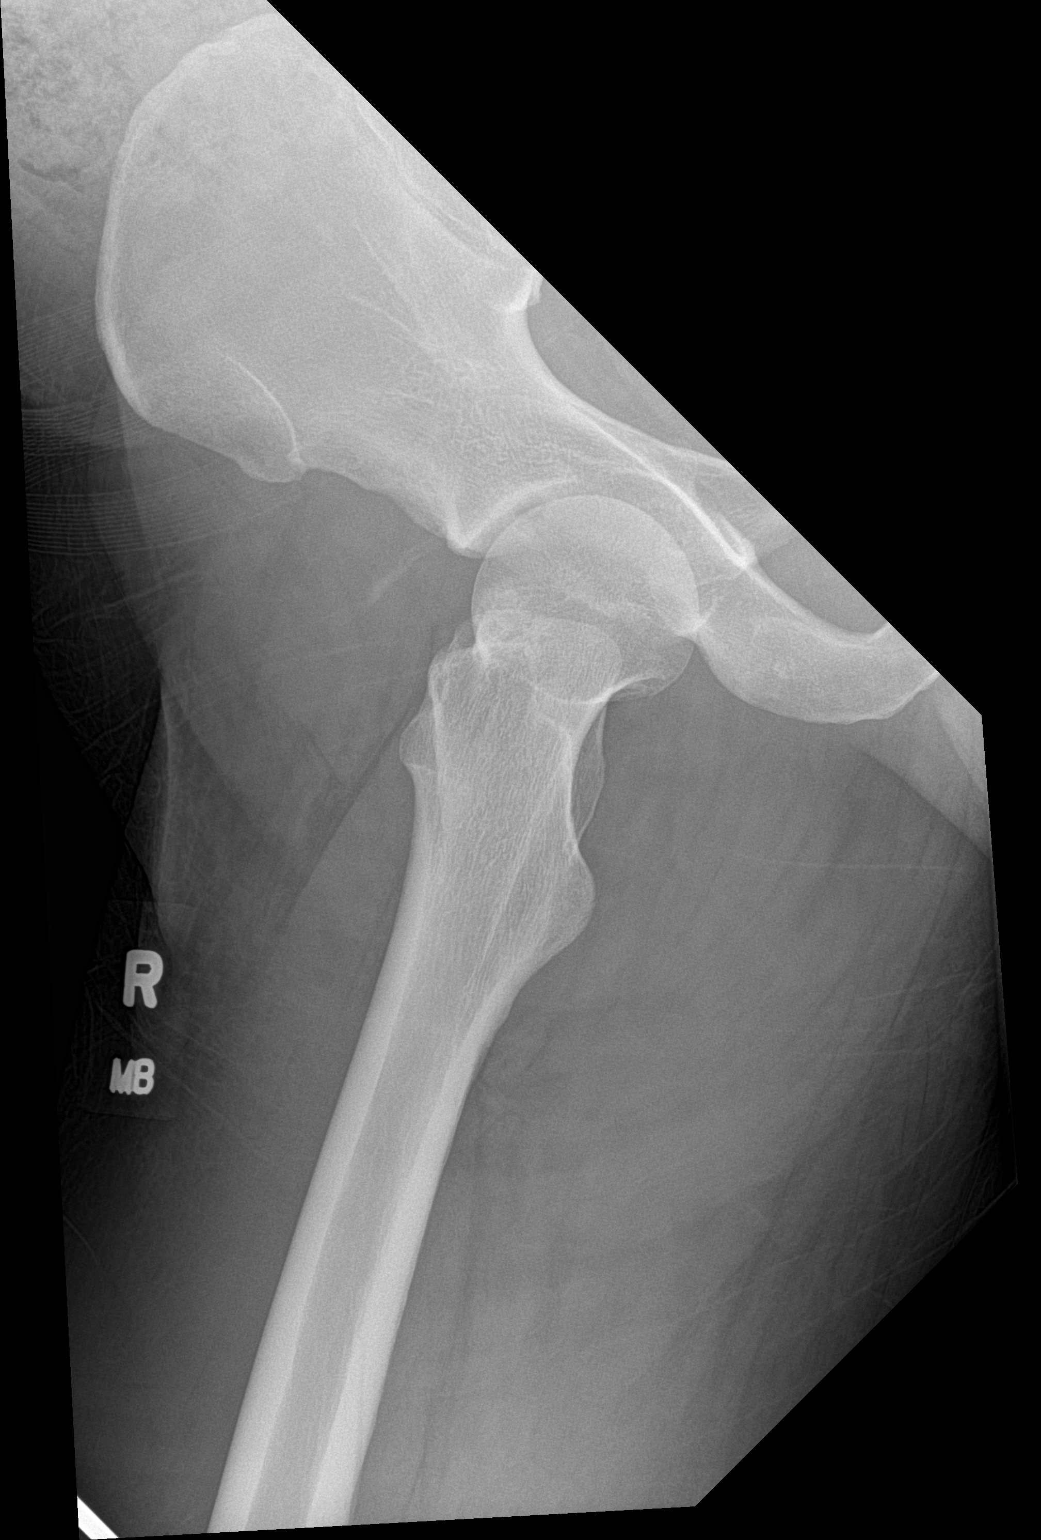

[3 of 3 positions shown; findings below may reference images not displayed]

FINDINGS: There is no evidence of hip fracture or dislocation. 8 mm lucency
with sclerotic margin in the superior acetabulum. There is no other
evidence of arthropathy or other focal bone abnormality.
IMPRESSION: 1. 8 mm lucency in the superior right acetabulum, may represent
degenerative cysts/geode, vs osteoid osteoma or other benign bone
lesion. Consider MR for further characterization if symptoms
persist.

## 2018-08-02 ENCOUNTER — Encounter (HOSPITAL_COMMUNITY): Payer: Self-pay | Admitting: Emergency Medicine

## 2018-08-02 ENCOUNTER — Ambulatory Visit (HOSPITAL_COMMUNITY)
Admission: EM | Admit: 2018-08-02 | Discharge: 2018-08-02 | Disposition: A | Discharge status: Home / Self Care | Payer: Self-pay | Attending: Family Medicine | Admitting: Family Medicine

## 2018-08-02 DIAGNOSIS — Z7251 High risk heterosexual behavior: Secondary | ICD-10-CM | POA: Insufficient documentation

## 2018-08-02 DIAGNOSIS — G5622 Lesion of ulnar nerve, left upper limb: Secondary | ICD-10-CM | POA: Insufficient documentation

## 2018-08-02 DIAGNOSIS — Z113 Encounter for screening for infections with a predominantly sexual mode of transmission: Secondary | ICD-10-CM

## 2018-08-02 DIAGNOSIS — Z202 Contact with and (suspected) exposure to infections with a predominantly sexual mode of transmission: Secondary | ICD-10-CM | POA: Insufficient documentation

## 2018-08-02 DIAGNOSIS — F17210 Nicotine dependence, cigarettes, uncomplicated: Secondary | ICD-10-CM | POA: Insufficient documentation

## 2018-08-02 MED ORDER — CEFTRIAXONE SODIUM 250 MG IJ SOLR: 250.0000 mg | Freq: Once | INTRAMUSCULAR | Status: DC

## 2018-08-02 MED ORDER — CEFTRIAXONE SODIUM 250 MG IJ SOLR
INTRAMUSCULAR | Status: AC
  Filled 2018-08-02: qty 250

## 2018-08-02 MED ORDER — AZITHROMYCIN 250 MG PO TABS
ORAL_TABLET | ORAL | Status: AC
  Filled 2018-08-02: qty 4

## 2018-08-02 MED ORDER — AZITHROMYCIN 250 MG PO TABS
1000.0000 mg | ORAL_TABLET | Freq: Once | ORAL | Status: AC
  Administered 2018-08-02: 1000 mg via ORAL

## 2018-08-02 MED ORDER — NAPROXEN 500 MG PO TABS: 500.0000 mg | ORAL_TABLET | Freq: Two times a day (BID) | ORAL | 0 refills | Status: AC

## 2018-08-02 MED ORDER — LIDOCAINE HCL (PF) 1 % IJ SOLN
INTRAMUSCULAR | Status: AC
  Filled 2018-08-02: qty 2

## 2018-08-02 NOTE — ED Provider Notes (Signed)
[facility] CARE CENTER   [id number] 08/02/18 Arrival Time: 0935  ASSESSMENT & PLAN:  1. Possible exposure to STD   2. High risk heterosexual behavior   3. Entrapment of left ulnar nerve    Empiric treatment given as below for possible exposure to gonorrhea/chlamydia.  Discharge Instructions     You have been given the following medications today for treatment of suspected gonorrhea and/or chlamydia:  cefTRIAXone (ROCEPHIN) injection 250 mg azithromycin (ZITHROMAX) tablet 1,000 mg  Even though we have treated you today, we have sent testing for sexually transmitted infections. We will notify you of any positive results once they are received. If required, we will prescribe any medications you might need.  Please refrain from all sexual activity for at least the next seven days.  Trial of Naprosyn for possible ulnar nerve entrapment.  Meds ordered this encounter  Medications  . naproxen (NAPROSYN) 500 MG tablet    Sig: Take 1 tablet (500 mg total) by mouth 2 (two) times daily.    Dispense:  14 tablet    Refill:  0  To f/u if not helping. Will try to avoid repetitive LUE motions at work. No splinting at this time.  Pending: Labs Reviewed  HIV ANTIBODY (ROUTINE TESTING W REFLEX)  RPR  URINE CYTOLOGY ANCILLARY ONLY   Will notify of any positive results from STD testing. Instructed to refrain from sexual activity for at least seven days.  Reviewed expectations re: course of current medical issues. Questions answered. Outlined signs and symptoms indicating need for more acute intervention. Patient verbalized understanding. After Visit Summary given.   SUBJECTIVE:  Martin Conrad is a 32 y.o. male who reports possible exposure to gonorrhea. A sexual partner of his reports recently testing positive. No penile discharge or bleeding. No urinary frequency or dysuria. Afebrile. No abdominal or pelvic pain. No n/v. No rashes or lesions. Sexually active with multiple male  partner. Occasional condom use. OTC treatment: none. History of similar symptoms: No h/o STD reported. Requests HIV/RPR testing.  Also reports several days of L 5th finger "numb feeling". Now feeling some of the same of L 4th finger. 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 No trauma reported. No h/o similar. No OTC treatment. Sensations of numbness stable since beginning. No extremity sensation changes or weakness. No specific aggravating or alleviating factors reported. He is R hand dominant.  ROS: As per HPI. All other systems negative.   OBJECTIVE:  Vitals:   08/02/18 1101  BP: 120/81  Pulse: 60  Resp: 16  Temp: 98.7 F (37.1 C)  SpO2: 100%    General appearance: alert, cooperative, appears stated age and no distress Throat: lips, mucosa, and tongue normal; teeth and gums normal Neck: supple without LAD Back: no CVA tenderness; FROM at waist Abdomen: soft, non-tender GU: declined; no inguinal LAD Ext: no joint edema or redness of LUE; normal ROM of elbow and wrist; normal skin temperature Skin: warm and dry Neuro: reports slightly decreased sensation of L 5th and lateral 4th finger only; normal ROM and grip strength of L hand; normal capillary refill of L hand Psychological: alert and cooperative; normal mood and affect.   Labs Reviewed  HIV ANTIBODY (ROUTINE TESTING W REFLEX)  RPR  URINE CYTOLOGY ANCILLARY ONLY    Allergies  Allergen Reactions  . Ceclor [Cefaclor] Hives    PMH: R hip pain.  Family History  Problem Relation Age of Onset  . Healthy Mother   . Healthy Father  Social History   Socioeconomic History  . Marital status: Single    Spouse name: Not on file  . Number of children: Not on file  . Years of education: Not on file  . Highest education level: Not on file  Occupational History  . Not on file  Social Needs  . Financial resource strain: Not on file  . Food insecurity:    Worry: Not on file    Inability: Not on file  .  Transportation needs:    Medical: Not on file    Non-medical: Not on file  Tobacco Use  . Smoking status: Current Every Day Smoker    Packs/day: 0.50    Types: Cigarettes  . Smokeless tobacco: Never Used  Substance and Sexual Activity  . Alcohol use: Yes  . Drug use: No  . Sexual activity: Not on file  Lifestyle  . Physical activity:    Days per week: Not on file    Minutes per session: Not on file  . Stress: Not on file  Relationships  . Social connections:    Talks on phone: Not on file    Gets together: Not on file    Attends religious service: Not on file    Active member of club or organization: Not on file    Attends meetings of clubs or organizations: Not on file    Relationship status: Not on file  . Intimate partner violence:    Fear of current or ex partner: Not on file    Emotionally abused: Not on file    Physically abused: Not on file    Forced sexual activity: Not on file  Other Topics Concern  . Not on file  Social History Narrative  . Not on file          Mardella LaymanHagler, Aadith Raudenbush, MD 08/02/18 1120

## 2018-08-02 NOTE — Discharge Instructions (Addendum)

## 2018-08-02 NOTE — ED Triage Notes (Signed)
Pt states his pinkie finger on his L hand has been numb for a few days, denies injury, full ROM. Pt also states his sexual partner called him today and told him they tested positive for gonorrhea and for him to get checked.

## 2018-08-03 LAB — URINE CYTOLOGY ANCILLARY ONLY
Chlamydia: NEGATIVE
Neisseria Gonorrhea: POSITIVE — AB
Trichomonas: NEGATIVE

## 2018-08-03 LAB — HIV ANTIBODY (ROUTINE TESTING W REFLEX): HIV Screen 4th Generation wRfx: NONREACTIVE

## 2018-08-04 ENCOUNTER — Telehealth (HOSPITAL_COMMUNITY): Payer: Self-pay

## 2018-08-04 NOTE — Telephone Encounter (Signed)
Test for gonorrhea was positive. This was treated at the urgent care visit with IM rocephin 250mg and po zithromax 1g. Pt needs education to refrain from sexual intercourse for 7 days after treatment to give the medicine time to work.  Sexual partners need to be notified and tested/treated.  Condoms may reduce risk of reinfection.  Recheck or followup with PCP for further evaluation if symptoms are not improving. [facility] notified.   Attempted to reach patient. No answer at this time. No voicemail available.  

## 2018-08-06 ENCOUNTER — Telehealth (HOSPITAL_COMMUNITY): Payer: Self-pay | Admitting: Emergency Medicine

## 2018-08-06 NOTE — Telephone Encounter (Signed)
Attempted to reach patient. Number not in service at this time.

## 2018-08-08 ENCOUNTER — Telehealth (HOSPITAL_COMMUNITY): Payer: Self-pay

## 2018-08-08 LAB — RPR, QUANT+TP ABS (REFLEX)
Rapid Plasma Reagin, Quant: 1:32 {titer} — ABNORMAL HIGH
T Pallidum Abs: REACTIVE — AB

## 2018-08-08 LAB — RPR: RPR Ser Ql: REACTIVE — AB

## 2018-08-08 NOTE — Telephone Encounter (Signed)
Attempted to reach patient x 3.   No answer.  Patient is also positive for Syphilis and needs to return for treatment.  Letter sent to patient.
